# Patient Record
Sex: Male | Born: 1970 | Race: Black or African American | Hispanic: No | Marital: Married | Smoking: Never smoker
Health system: Southern US, Community
[De-identification: ages and names within clinical notes are randomized; demographics above are authoritative.]

---

## 2005-06-28 ENCOUNTER — Emergency Department (HOSPITAL_COMMUNITY): Admission: EM | Admit: 2005-06-28 | Discharge: 2005-06-28 | Payer: Self-pay | Admitting: Family Medicine

## 2005-07-26 ENCOUNTER — Encounter: Admission: RE | Admit: 2005-07-26 | Discharge: 2005-07-26 | Payer: Self-pay | Admitting: Occupational Medicine

## 2010-02-14 ENCOUNTER — Emergency Department (HOSPITAL_COMMUNITY): Admission: EM | Admit: 2010-02-14 | Discharge: 2010-02-14 | Payer: Self-pay | Admitting: Family Medicine

## 2019-08-09 ENCOUNTER — Other Ambulatory Visit: Payer: Self-pay

## 2019-08-09 ENCOUNTER — Encounter (HOSPITAL_COMMUNITY): Payer: Self-pay | Admitting: Emergency Medicine

## 2019-08-09 ENCOUNTER — Ambulatory Visit (INDEPENDENT_AMBULATORY_CARE_PROVIDER_SITE_OTHER): Payer: Managed Care, Other (non HMO)

## 2019-08-09 ENCOUNTER — Ambulatory Visit (HOSPITAL_COMMUNITY)
Admission: EM | Admit: 2019-08-09 | Discharge: 2019-08-09 | Disposition: A | Discharge status: Home / Self Care | Payer: Managed Care, Other (non HMO) | Attending: Internal Medicine | Admitting: Internal Medicine

## 2019-08-09 DIAGNOSIS — S161XXA Strain of muscle, fascia and tendon at neck level, initial encounter: Secondary | ICD-10-CM

## 2019-08-09 DIAGNOSIS — M545 Low back pain: Secondary | ICD-10-CM

## 2019-08-09 MED ORDER — IBUPROFEN 600 MG PO TABS: 600.0000 mg | ORAL_TABLET | Freq: Four times a day (QID) | ORAL | 0 refills | Status: AC | PRN

## 2019-08-09 MED ORDER — CYCLOBENZAPRINE HCL 10 MG PO TABS: 10.0000 mg | ORAL_TABLET | Freq: Three times a day (TID) | ORAL | 0 refills | Status: AC | PRN
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
## 2019-09-06 ENCOUNTER — Other Ambulatory Visit: Payer: Self-pay | Admitting: Chiropractic Medicine

## 2019-09-06 DIAGNOSIS — S43402A Unspecified sprain of left shoulder joint, initial encounter: Secondary | ICD-10-CM

## 2019-09-06 DIAGNOSIS — M25562 Pain in left knee: Secondary | ICD-10-CM

## 2019-09-06 DIAGNOSIS — S134XXA Sprain of ligaments of cervical spine, initial encounter: Secondary | ICD-10-CM

## 2019-09-23 ENCOUNTER — Other Ambulatory Visit: Payer: Managed Care, Other (non HMO)

## 2019-10-14 ENCOUNTER — Ambulatory Visit: Payer: Managed Care, Other (non HMO)

## 2019-10-14 ENCOUNTER — Ambulatory Visit
Admission: RE | Admit: 2019-10-14 | Discharge: 2019-10-14 | Disposition: A | Discharge status: Home / Self Care | Payer: Managed Care, Other (non HMO) | Source: Ambulatory Visit | Attending: Chiropractic Medicine | Admitting: Chiropractic Medicine

## 2019-10-14 ENCOUNTER — Other Ambulatory Visit: Payer: Self-pay

## 2019-10-14 DIAGNOSIS — S43402A Unspecified sprain of left shoulder joint, initial encounter: Secondary | ICD-10-CM

## 2019-10-14 DIAGNOSIS — M25562 Pain in left knee: Secondary | ICD-10-CM
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
